# Patient Record
Sex: Female | Born: 1980 | Race: White | Hispanic: No | Marital: Married | Smoking: Never smoker
Health system: Southern US, Community
[De-identification: ages and names within clinical notes are randomized; demographics above are authoritative.]

## PROBLEM LIST (undated history)

## (undated) DIAGNOSIS — G8929 Other chronic pain: Secondary | ICD-10-CM

## (undated) DIAGNOSIS — M545 Low back pain, unspecified: Secondary | ICD-10-CM

## (undated) HISTORY — PX: TURBINATE REDUCTION: SHX6157

## (undated) HISTORY — PX: KNEE SURGERY: SHX244

## (undated) HISTORY — PX: ABDOMINAL HYSTERECTOMY: SHX81

## (undated) HISTORY — PX: CHOLECYSTECTOMY: SHX55

## (undated) HISTORY — PX: NASAL SEPTUM SURGERY: SHX37

---

## 2018-05-23 ENCOUNTER — Encounter: Payer: Self-pay | Admitting: Emergency Medicine

## 2018-05-23 ENCOUNTER — Other Ambulatory Visit: Payer: Self-pay

## 2018-05-23 ENCOUNTER — Emergency Department
Admission: EM | Admit: 2018-05-23 | Discharge: 2018-05-23 | Disposition: A | Discharge status: Home / Self Care | Payer: Managed Care, Other (non HMO) | Attending: Emergency Medicine | Admitting: Emergency Medicine

## 2018-05-23 DIAGNOSIS — G8929 Other chronic pain: Secondary | ICD-10-CM | POA: Insufficient documentation

## 2018-05-23 DIAGNOSIS — M545 Low back pain: Secondary | ICD-10-CM | POA: Diagnosis present

## 2018-05-23 DIAGNOSIS — M5440 Lumbago with sciatica, unspecified side: Secondary | ICD-10-CM | POA: Diagnosis not present

## 2018-05-23 HISTORY — DX: Low back pain, unspecified: M54.50

## 2018-05-23 HISTORY — DX: Other chronic pain: G89.29

## 2018-05-23 HISTORY — DX: Low back pain: M54.5

## 2018-05-23 MED ORDER — KETOROLAC TROMETHAMINE 30 MG/ML IJ SOLN
30.0000 mg | Freq: Once | INTRAMUSCULAR | Status: AC
  Administered 2018-05-23: 30 mg via INTRAMUSCULAR
  Filled 2018-05-23: qty 1

## 2018-05-23 MED ORDER — ORPHENADRINE CITRATE 30 MG/ML IJ SOLN
60.0000 mg | Freq: Two times a day (BID) | INTRAMUSCULAR | Status: DC
  Administered 2018-05-23: 60 mg via INTRAMUSCULAR
  Filled 2018-05-23: qty 2

## 2018-05-23 NOTE — Discharge Instructions (Addendum)
Follow-up with your regular doctor for continued back pain.  It is our policy in the emergency department to not provide narcotics for chronic pain.  We recommend that you take Tylenol or ibuprofen for pain as needed.  Apply ice to the lower back.  Return if worsening.

## 2018-05-23 NOTE — ED Provider Notes (Signed)
[facility] Emergency Department Provider Note  ____________________________________________   First MD Initiated Contact with Patient 05/23/18 1622     (approximate)  I have reviewed the triage vital signs and the nursing notes.   HISTORY  Chief Complaint Back Pain    HPI Redith Drach is a 37 y.o. female  C/o low back pain for several months, she states this is a chronic problem.  She states she has known bulging disc and has been referred to a neurosurgeon but her regular doctor will not give her any more pain medication.  She states that the pain and numbness running down both legs.  No known injury, pain is worse with movement, increased with bending over, denies  tingling, or changes in bowel/urinary habits,  Using otc meds without relief Remainder ros neg   Past Medical History:  Diagnosis Date  . Chronic lower back pain     There are no active problems to display for this patient.   Past Surgical History:  Procedure Laterality Date  . ABDOMINAL HYSTERECTOMY    . CESAREAN SECTION    . CHOLECYSTECTOMY    . KNEE SURGERY    . NASAL SEPTUM SURGERY    . TURBINATE REDUCTION      Prior to Admission medications   Not on File    Allergies Doxycycline; Gabapentin; and Tramadol  History reviewed. No pertinent family history.  Social History Social History   Tobacco Use  . Smoking status: Never Smoker  . Smokeless tobacco: Never Used  Substance Use Topics  . Alcohol use: Never    Frequency: Never  . Drug use: Never    Review of Systems  Constitutional: No fever/chills Eyes: No visual changes. ENT: No sore throat. Respiratory: Denies cough Genitourinary: Negative for dysuria. Musculoskeletal: Positive for back pain. Skin: Negative for rash.    ____________________________________________   PHYSICAL EXAM:  VITAL SIGNS: ED Triage Vitals  Enc Vitals Group     BP 05/23/18 1533 132/78     Pulse Rate 05/23/18 1533 89      Resp 05/23/18 1533 16     Temp 05/23/18 1533 98.1 F (36.7 C)     Temp Source 05/23/18 1533 Oral     SpO2 05/23/18 1533 100 %     Weight 05/23/18 1533 198 lb (89.8 kg)     Height 05/23/18 1533 5\' 2"  (1.575 m)     Head Circumference --      Peak Flow --      Pain Score 05/23/18 1547 9     Pain Loc --      Pain Edu? --      Excl. in GC? --     Constitutional: Alert and oriented. Well appearing and in no acute distress. Eyes: Conjunctivae are normal.  Head: Atraumatic. Nose: No congestion/rhinnorhea. Mouth/Throat: Mucous membranes are moist.   Neck:  supple no lymphadenopathy noted Cardiovascular: Normal rate, regular rhythm. Heart sounds are normal Respiratory: Normal respiratory effort.  No retractions, lungs c t a  GU: deferred Musculoskeletal: FROM all extremities, warm and well perfused.  Decreased rom of back due to discomfort, lumbar spine nontender, negative Slr, 10/10 strength in great toes b/l, 10/10 strength in lower legs, n/v intact Neurologic:  Normal speech and language.  Skin:  Skin is warm, dry and intact. No rash noted. Psychiatric: Mood and affect are normal. Speech and behavior are normal.  ____________________________________________   LABS (all labs ordered are listed, but only abnormal results are displayed)  Labs Reviewed - No data to display ____________________________________________   ____________________________________________  RADIOLOGY    ____________________________________________   PROCEDURES  Procedure(s) performed: Toradol 30 mg IM, Norflex 60 mg IM  Procedures    ____________________________________________   INITIAL IMPRESSION / ASSESSMENT AND PLAN / ED COURSE  Pertinent labs & imaging results that were available during my care of the patient were reviewed by me and considered in my medical decision making (see chart for details).   Patient is a 37 year old female presents emergency department with chronic back  pain.  She states that she has an appointment with a neurosurgeon but it is not until October.  She states that her regular doctor will not give her pain medications as she has been referred to the neurosurgeon.  She states that she continues to have back pain over-the-counter medications do not help.  She denies any loss of control of her bowel or bladder.  On physical exam patient appears well and talkative.  She did not appear to be in discomfort.  The exam is basically benign.  Discussed the findings with patient.  Explained to her we do not provide narcotics for chronic back pain.  That her overdose score on the [place] prescribing website is over 600.  Therefore I will not be able to provide her with narcotics as it is our policy in the ER not to treat chronic pain.  She is requesting a Dilaudid shot but she was refused.  She then bargains for Toradol and if there is anything else that she can have.  She was given a Toradol injection and Norflex.  She is to follow-up with her regular doctor.  She is to use over-the-counter medications.  She was offered Lidoderm patches which she refused.  She also refused meloxicam or muscle relaxer.  She was discharged in stable condition.     As part of my medical decision making, I reviewed the following data within the electronic MEDICAL RECORD NUMBER Nursing notes reviewed and incorporated, Old chart reviewed, Notes from prior ED visits and [place] Controlled Substance Database  ____________________________________________   FINAL CLINICAL IMPRESSION(S) / ED DIAGNOSES  Final diagnoses:  Chronic midline low back pain with sciatica, sciatica laterality unspecified      NEW MEDICATIONS STARTED DURING THIS VISIT:  New Prescriptions   No medications on file     Note:  This document was prepared using Dragon voice recognition software and may include unintentional dictation errors.     Faythe GheeFisher, Dessie Tatem W, PA-C 05/23/18 1723    Merrily Brittleifenbark, Neil,  MD 05/24/18 [phone]

## 2018-05-23 NOTE — ED Notes (Signed)
Pt reports  Chronic  Back pain worse over last few  Months. Hurts all the time  Except when laying still . Denies any recent injury Denies any urinary symptoms. Patient states has some numbness down legs and neuropathy as well

## 2018-05-23 NOTE — ED Triage Notes (Addendum)
Here for chronic back pain. Has known bulging discs, referred to [facility] but cannot get in until October.  They will not treat her pain since not a patient yet.  Needs something for pain until can see them. No loss bowel or bladder. Ambulatory.

## 2018-06-02 ENCOUNTER — Other Ambulatory Visit: Payer: Self-pay

## 2018-06-02 ENCOUNTER — Emergency Department
Admission: EM | Admit: 2018-06-02 | Discharge: 2018-06-02 | Disposition: A | Discharge status: Home / Self Care | Payer: 59 | Attending: Emergency Medicine | Admitting: Emergency Medicine

## 2018-06-02 ENCOUNTER — Emergency Department: Payer: 59

## 2018-06-02 DIAGNOSIS — R42 Dizziness and giddiness: Secondary | ICD-10-CM | POA: Diagnosis not present

## 2018-06-02 DIAGNOSIS — Z79899 Other long term (current) drug therapy: Secondary | ICD-10-CM | POA: Insufficient documentation

## 2018-06-02 DIAGNOSIS — M791 Myalgia, unspecified site: Secondary | ICD-10-CM | POA: Diagnosis not present

## 2018-06-02 DIAGNOSIS — M7918 Myalgia, other site: Secondary | ICD-10-CM

## 2018-06-02 DIAGNOSIS — M25512 Pain in left shoulder: Secondary | ICD-10-CM | POA: Diagnosis present

## 2018-06-02 LAB — GLUCOSE, CAPILLARY: Glucose-Capillary: 104 mg/dL — ABNORMAL HIGH (ref 70–99)

## 2018-06-02 MED ORDER — KETOROLAC TROMETHAMINE 30 MG/ML IJ SOLN
30.0000 mg | Freq: Once | INTRAMUSCULAR | Status: AC
  Administered 2018-06-02: 30 mg via INTRAMUSCULAR
  Filled 2018-06-02: qty 1

## 2018-06-02 MED ORDER — CYCLOBENZAPRINE HCL 10 MG PO TABS: 10.0000 mg | ORAL_TABLET | Freq: Three times a day (TID) | ORAL | 0 refills | Status: AC | PRN

## 2018-06-02 MED ORDER — IBUPROFEN 600 MG PO TABS: 600.0000 mg | ORAL_TABLET | Freq: Three times a day (TID) | ORAL | 0 refills | Status: AC | PRN
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
## 2018-06-02 NOTE — Discharge Instructions (Signed)
Your cough medicines containing codeine which cause drowsiness.  You should not operate machinery or driving while taking this medication.
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
